# Patient Record
Sex: Male | Born: 1960 | Race: Black or African American | Hispanic: No | Marital: Single
Health system: Southern US, Community
[De-identification: ages and names within clinical notes are randomized; demographics above are authoritative.]

---

## 2005-01-02 ENCOUNTER — Ambulatory Visit: Payer: Self-pay | Admitting: Family Medicine

## 2005-02-21 ENCOUNTER — Ambulatory Visit: Payer: Self-pay | Admitting: Family Medicine

## 2005-08-05 ENCOUNTER — Ambulatory Visit: Payer: Self-pay | Admitting: Family Medicine

## 2005-09-03 ENCOUNTER — Ambulatory Visit: Payer: Self-pay | Admitting: Family Medicine

## 2005-11-12 ENCOUNTER — Ambulatory Visit: Payer: Self-pay | Admitting: Family Medicine

## 2010-09-15 ENCOUNTER — Inpatient Hospital Stay (HOSPITAL_COMMUNITY): Admission: AC | Admit: 2010-09-15 | Discharge: 2010-09-18 | Payer: Self-pay

## 2011-02-06 LAB — GLUCOSE, CAPILLARY
Glucose-Capillary: 122 mg/dL — ABNORMAL HIGH (ref 70–99)
Glucose-Capillary: 165 mg/dL — ABNORMAL HIGH (ref 70–99)
Glucose-Capillary: 172 mg/dL — ABNORMAL HIGH (ref 70–99)
Glucose-Capillary: 191 mg/dL — ABNORMAL HIGH (ref 70–99)
Glucose-Capillary: 193 mg/dL — ABNORMAL HIGH (ref 70–99)
Glucose-Capillary: 195 mg/dL — ABNORMAL HIGH (ref 70–99)
Glucose-Capillary: 205 mg/dL — ABNORMAL HIGH (ref 70–99)

## 2011-02-06 LAB — BASIC METABOLIC PANEL
BUN: 13 mg/dL (ref 6–23)
Calcium: 8.2 mg/dL — ABNORMAL LOW (ref 8.4–10.5)
Creatinine, Ser: 1.05 mg/dL (ref 0.4–1.5)
GFR calc Af Amer: >60 mL/min (ref >60)
GFR calc non Af Amer: >60 mL/min (ref >60)
GFR calc non Af Amer: >60 mL/min (ref >60)
Glucose, Bld: 240 mg/dL — ABNORMAL HIGH (ref 70–99)
Potassium: 3.8 mEq/L (ref 3.5–5.1)
Potassium: 4.5 mEq/L (ref 3.5–5.1)
Sodium: 136 mEq/L (ref 135–145)

## 2011-02-06 LAB — DIFFERENTIAL
Basophils Absolute: 0 10*3/uL (ref 0.0–0.1)
Basophils Relative: 0 % (ref 0–1)
Eosinophils Relative: 0 % (ref 0–5)
Monocytes Absolute: 0.6 10*3/uL (ref 0.1–1.0)
Neutro Abs: 9.5 10*3/uL — ABNORMAL HIGH (ref 1.7–7.7)

## 2011-02-06 LAB — CBC
HCT: 35 % — ABNORMAL LOW (ref 39.0–52.0)
Hemoglobin: 10.2 g/dL — ABNORMAL LOW (ref 13.0–17.0)
MCHC: 32.9 g/dL (ref 30.0–36.0)
MCV: 90.9 fL (ref 78.0–100.0)
Platelets: 298 10*3/uL (ref 150–400)
RBC: 3.41 MIL/uL — ABNORMAL LOW (ref 4.22–5.81)
RDW: 14 % (ref 11.5–15.5)
WBC: 8.2 10*3/uL (ref 4.0–10.5)

## 2011-02-06 LAB — HEMOGLOBIN A1C
Hgb A1c MFr Bld: 8.9 % — ABNORMAL HIGH (ref <5.7)
Mean Plasma Glucose: 209 mg/dL — ABNORMAL HIGH (ref <117)
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
## 2018-12-16 ENCOUNTER — Telehealth: Payer: Self-pay | Admitting: Student

## 2018-12-16 NOTE — Telephone Encounter (Signed)
Faxed prescription request to [facility] pharmacy in [place], [place] [phone])- Plavix 75 mg tablets, take one tablet by mouth once daily, dispense 90 tablets with 3 refills.  Waylan Boga Louk, PA-C 12/16/2018, 9:38 AM

## 2019-01-06 ENCOUNTER — Telehealth: Payer: Self-pay | Admitting: Physician Assistant

## 2019-01-06 NOTE — Telephone Encounter (Signed)
Received request for Plavix refill - per chart this was faxed in 12/16/18 with 3 refills. Re-[facility] this [facility] today again [facility] [address], [place], [place] [id number].

## 2020-04-20 ENCOUNTER — Telehealth: Payer: Self-pay | Admitting: Student

## 2020-04-20 NOTE — Telephone Encounter (Signed)
Per Dr. Archer Asa, refill of Plavix 75 mg PO daily #90 called in to patient's preferred pharmacy of [facility] on [address]   Loyce Dys, MS RD PA-C

## 2020-11-10 ENCOUNTER — Telehealth: Payer: Self-pay | Admitting: Student

## 2020-11-10 NOTE — Telephone Encounter (Signed)
Per Dr. Archer Asa, refill of Plavix 75 mg PO daily #90 with 1 refill called in to patient's preferred pharmacy of [facility] on [address]   Loyce Dys, MS RD PA-C 4:09 PM

## 2021-02-15 ENCOUNTER — Telehealth: Payer: Self-pay | Admitting: Student

## 2021-02-15 NOTE — Telephone Encounter (Signed)
Refill for Plavix 75 mg PO with 3 refills sent to patient's preferred pharmacy [facility] in [place].  Loyce Dys, MS RD PA-C

## 2022-08-22 ENCOUNTER — Telehealth (HOSPITAL_COMMUNITY): Payer: Self-pay | Admitting: Student

## 2022-08-22 MED ORDER — CLOPIDOGREL BISULFATE 75 MG PO TABS: 75.0000 mg | ORAL_TABLET | Freq: Every day | ORAL | 2 refills | Status: AC

## 2022-08-22 NOTE — Telephone Encounter (Signed)
Plavix 75 mg tablets, 1 tablet by mouth daily x 90 days e-prescribed to the patient's [facility] in [place], [place]. Voice message left for patient.   Soyla Dryer, [place] [phone] 08/22/2022, 8:30 AM

## 2022-12-02 ENCOUNTER — Other Ambulatory Visit (HOSPITAL_COMMUNITY): Payer: Self-pay | Admitting: Student

## 2022-12-02 ENCOUNTER — Telehealth (HOSPITAL_COMMUNITY): Payer: Self-pay | Admitting: Student

## 2022-12-02 MED ORDER — CLOPIDOGREL BISULFATE 75 MG PO TABS: 75.0000 mg | ORAL_TABLET | Freq: Every day | ORAL | 3 refills | Status: AC

## 2022-12-02 NOTE — Telephone Encounter (Signed)
Refill for Plavix 75 mg PO daily e-prescribed to the patient's [facility] in [place]. Ok to refill per Dr. Laurence Ferrari. 30 day supply with 3 refills.   Soyla Dryer, [place] [phone] 12/02/2022, 1:09 PM

## 2023-04-03 ENCOUNTER — Telehealth (HOSPITAL_COMMUNITY): Payer: Self-pay | Admitting: Student

## 2023-04-03 MED ORDER — CLOPIDOGREL BISULFATE 75 MG PO TABS: 75.0000 mg | ORAL_TABLET | Freq: Every day | ORAL | 6 refills | Status: DC

## 2023-04-03 NOTE — Telephone Encounter (Signed)
Plavix 75 mg one tablet daily, dispense #30 with 6 refills e-[facility] [place], [place]  Alwyn Ren, AGACNP-BC [phone] 04/03/2023, 12:52 PM

## 2023-11-14 ENCOUNTER — Telehealth (HOSPITAL_COMMUNITY): Payer: Self-pay | Admitting: Student

## 2023-11-14 MED ORDER — CLOPIDOGREL BISULFATE 75 MG PO TABS: 75.0000 mg | ORAL_TABLET | Freq: Every day | ORAL | 12 refills | Status: AC

## 2023-11-14 NOTE — Telephone Encounter (Signed)
Plavix 75 mg daily refill e-prescribed to  [facility] in [place], [place]. Dispense 30 with 12 refills.  Alwyn Ren, AGACNP-BC 11/14/2023, 1:45 PM

## 2024-11-11 NOTE — Progress Notes (Signed)
 Vascular Surgery Clinic Note:  Reason for Visit: PAD PCP: Hunter Gutierrez., MD  Assessment and Plan:  [phone] with hx of HTN, HLD, Dm2, and PAD with prior RLE SFA DCB and athrectomy in 2018 being seen for surveillance.  We reviewed the images from the ABI which demonstrates an ABI of R 1.0 and L 0.97, palpable bilaterally. Reduce waveform in the foot.  Best medical management includes ASA, Statin, and maintained smoking cessation. Cont surveillance in 1 year.   - PAD with L SFA intervention - f/u in 1 year with ABI for surveillance  Greater 50% of today's clinic visit was spent on counseling.   HPI:  42M with hx of HTN, HLD, Dm2, and PAD with prior RLE SFA DCB and athrectomy in 2018 being seen for surveillance and evaluation of carotid bruit.   Doing well. No new issues.  Retired but remains active. No rest pain, claudication or wounds.   In brief, he had his right leg treated in 2018 for walking pain and pain at night. He has resolution of his symptoms and was recently seen for this issue 1 month ago with normal studies. He otherwise denies walking pain, rest pain, or wounds. Additionally, no prior stroke or TIA. No events concerning for neurologic or vision changes. Former smoker. Quit several years ago.   Past Medical History:  Diagnosis Date   Cataract    both eyes   Diabetes mellitus (HCC)    Hypercholesterolemia    Hypertension    Past Surgical History:  Procedure Laterality Date   CATARACT EXTRACTION W/  INTRAOCULAR LENS IMPLANT Bilateral 2019   FEMORAL ARTERY ANGIOPLASTY Right 2018   common and superficial, no stent   LAPAROSCOPIC CHOLECYSTECTOMY N/A 2020   PILONIDAL CYST / SINUS EXCISION N/A    SCROTAL SURGERY N/A    infection   Prior to Admission medications   Medication Sig Start Date End Date Taking? Authorizing Provider  chlorhexidine (HIBICLENS) 4 % external liquid Apply topically daily as needed. Wash groin areas twice daily and as needed 04/23/22    Argenta, Hunter E, MD  clopidogreL  (PLAVIX ) 75 mg tablet  *ANTIPLATELET* TK 1 T PO QD 05/05/20   Dough, Hunter Gutierrez., MD  dicyclomine (BENTYL) 20 mg tablet Take 1 tablet (20 mg total) by mouth every 6 (six) hours. 11/09/21   [provider]  dorzolamide-timoloL (COSOPT) 22.3-6.8 mg/mL ophthalmic solution INSTILL 1 DROP IN BOTH EYES TWICE DAILY 06/17/22   [provider]  DUREZOL 0.05 % ophthalmic solution Place 1 drop into both eyes 2 times daily. 10/09/18   [provider]  ezetimibe (ZETIA) 10 mg tablet Take 1 tablet (10 mg total) by mouth daily. cholesterol 08/20/22   Dough, Hunter Gutierrez., MD  ibuprofen (ADVIL,MOTRIN) 600 MG tablet Take 1 tablet (600 mg total) by mouth every 6 (six) hours as needed.    [provider]  JARDIANCE 25 mg Tab tablet TAKE 1 TABLET BY MOUTH DAILY FOR DIABETES 04/14/22   Dough, Hunter Gutierrez., MD  LINZESS 290 mcg capsule TAKE 1 CAPSULE(290 MCG) BY MOUTH DAILY FOR CONSTIPATION 05/30/22   Dough, Hunter Gutierrez., MD  metFORMIN (GLUCOPHAGE) 500 MG tablet Take 1 tablet (500 mg total) by mouth 2 times daily with meals. diabetes 09/05/21   Dough, Hunter Gutierrez., MD  olmesartan-amLODIPine-hydrochlorothiazide Hunter Gutierrez) 40-10-12.5 mg per tablet TAKE 1 TABLET BY MOUTH EVERY MORNING FOR HIGH BLOOD PRESSURE 10/21/21   Dough, Hunter Gutierrez., MD  pantoprazole (PROTONIX) 40 MG  tablet Take 1 tablet (40 mg total) by mouth daily. reflux 06/06/21   Dough, Hunter Gutierrez., MD  pen needle, diabetic (BD INSULIN PEN NEEDLE UF SHORT) 31 gauge x 5/16 Ndle Place 2 Units onto the skin daily. 04/05/16   Dough, Hunter Gutierrez., MD  polyethylene glycol (GLYCOLAX, MIRALAX) 17 gram/dose powder MIX AND TAKE 17 GRAMS BY MOUTH ONCE DAILY 03/05/20   [provider]  rosuvastatin (CRESTOR) 40 MG tablet TAKE 1 TABLET(40 MG) BY MOUTH DAILY FOR CHOLESTEROL 07/27/22   Dough, Hunter Gutierrez., MD  tamsulosin (FLOMAX) 0.4 mg Cap capsule TAKE 1 CAPSULE BY MOUTH EVERY DAY 30  MINUTES AFTER SUPPER 11/12/21   [provider]  TRULICITY 3 mg/0.5 mL subcutaneous pen INJECT 3MG  UNDER THE SKIN ONCE EVERY WEEK 04/29/22   Dough, Hunter Gutierrez., MD   Allergies  Allergen Reactions   Latex Rash (ALLERGY/intolerance)    Pt reports it breaks me out   Social History   Tobacco Use   Smoking status: Former    Types: Cigarettes    Start date: 32    Quit date: 10/27/2017    Years since quitting: 4.9   Smokeless tobacco: Never  Substance Use Topics   Alcohol use: Yes    Comment: occ   Drug use: Yes    Types: Marijuana   Family History  Problem Relation Age of Onset   Diabetes Mother    Prostate cancer Brother    Colon cancer Neg Hx    Other Neg Hx        PASCVD    Review systems:  Complete review of systems negative other than what was mentioned in HPI and PMH.  Physical Exam:  Alert and Oriented BP 130/75 (BP Location: Left arm, Patient Position: Sitting)   Pulse 75   Ht 1.753 m (5' 9)   Wt 89.4 kg (197 lb)   SpO2 98%   BMI 29.09 kg/m  Neck soft and supple; No carotid bruits Regular, rate, and rhythm; no murmurs, rubs or gallops Clear to auscultation bilaterally Abd soft, Nontender DP and PT palpable bilaterally.  No open wounds or edema.   Imaging:  Images were personally reviewed and interpreted.   Hunter Chihuahua, MD Vascular and Endovascular Surgery [phone]

## 2024-11-16 NOTE — H&P (Signed)
 Gastroenterology Preprocedural History and Physical     Chief Complaint/Reason for Procedure: Hunter Gutierrez is a 63 y.o. male scheduled for a EUS, for the following indication Abnormal Imaging using deep sedation with propofol or general anesthesia as per anesthesia provider .  A History and Physical has been performed and patient medication allergies have been reviewed. The patient's tolerance of previous anesthesia has been reviewed. The risks and benefits of the procedure and the sedation options and risks were discussed with the patient. All questions were answered and informed consent obtained.  HPI  Medical History[1]  Surgical History[2]  Family History[3]  Social History   Socioeconomic History   Marital status: Married    Spouse name: Not on file   Number of children: Not on file   Years of education: HS +   Highest education level: Not on file  Occupational History   Not on file  Tobacco Use   Smoking status: Former    Current packs/day: 0.00    Types: Cigarettes    Start date: 11/25/1988    Quit date: 10/27/2017    Years since quitting: 7.0    Passive exposure: Past   Smokeless tobacco: Never  Vaping Use   Vaping status: Never Used  Substance and Sexual Activity   Alcohol use: Yes    Alcohol/week: 1.0 standard drink of alcohol    Types: 1 Cans of beer per week   Drug use: Yes    Types: Marijuana    Comment: once or twice a month   Sexual activity: Not on file  Other Topics Concern   Not on file  Social History Narrative   Not on file   Social Drivers of Health   Living Situation: Low Risk (09/10/2024)   Living Situation    What is your living situation today?: I have a steady place to live    Think about the place you live. Do you have problems with any of the following? Choose all that apply:: None/None on this list  Food Insecurity: Low Risk (09/10/2024)   Food vital sign    Within the past 12 months, you worried that your  food would run out before you got money to buy more: Never true    Within the past 12 months, the food you bought just didn't last and you didn't have money to get more: Never true  Transportation Needs: No Transportation Needs (09/10/2024)   Transportation    In the past 12 months, has lack of reliable transportation kept you from medical appointments, meetings, work or from getting things needed for daily living? : No  Utilities: Low Risk (09/10/2024)   Utilities    In the past 12 months has the electric, gas, oil, or water company threatened to shut off services in your home? : No  Safety: Low Risk (09/10/2024)   Safety    How often does anyone, including family and friends, physically hurt you?: Never    How often does anyone, including family and friends, insult or talk down to you?: Never    How often does anyone, including family and friends, threaten you with harm?: Never    How often does anyone, including family and friends, scream or curse at you?: Never  Alcohol Screening: Not At Risk (09/10/2024)   Alcohol    Audit C Alcohol risk score: 1  Tobacco Use: Medium Risk (11/16/2024)   Patient History    Smoking Tobacco Use: Former    Smokeless Tobacco Use: Never  Passive Exposure: Past  Depression: Not At Risk (11/12/2024)   PHQ-2    PHQ-2 Score: 0  Social Connections: Not on file  Financial Resource Strain: Not on file    Current Rx ordered in [facility][4]  Allergies[5]    Physical Exam:  There were no vitals filed for this visit. There is no height or weight on file to calculate BMI.  Airway:  MALLAMPATI TWO   Heart:  normal S1 and S2 Lungs:  clear Abdomen:  soft, nontender, normal bowel sounds Mental Status:  awake and alert; oriented to person, place, and time     ASA Grade Assessment: ASA 2 - Patient with mild systemic disease with no functional limitations   I have reviewed patient's health history and patient is cleared to proceed with the  proposed procedure at this facility.   Selinda JONETTA Southgate, MD       [1] Past Medical History: Diagnosis Date   Cataract    both eyes   Diabetes mellitus (CMD)    GERD (gastroesophageal reflux disease)    Glaucoma    Hypercholesterolemia    Hypertension   [2] Past Surgical History: Procedure Laterality Date   CATARACT EXTRACTION W/  INTRAOCULAR LENS IMPLANT Bilateral 2019   Procedure: CATARACT EXTRACTION W/  INTRAOCULAR LENS IMPLANT   FEMORAL ARTERY REPAIR Right 2018   Procedure: FEMORAL ARTERY ANGIOPLASTY; common and superficial, no stent   LAPAROSCOPIC CHOLECYSTECTOMY N/A 2020   Procedure: LAPAROSCOPIC CHOLECYSTECTOMY   PILONIDAL CYST / SINUS EXCISION N/A    Procedure: PILONIDAL CYST / SINUS EXCISION   SCROTAL SURGERY N/A    Procedure: SCROTAL SURGERY; infection  [3] Family History Problem Relation Name Age of Onset   Diabetes Mother Orlean    Prostate cancer Brother Josefa    Hypertension Brother Josefa    Colon cancer Neg Hx     Other Neg Hx         PASCVD  [4] Meds Ordered in [facility]  Medication Sig Dispense Refill   dicyclomine (BENTYL) 10 mg capsule Take 1 capsule (10 mg total) by mouth every 8 (eight) hours. As needed, bowel spasm 90 capsule 0   Jardiance 25 mg tab TAKE 1 TABLET BY MOUTH DAILY FOR DIABETES 30 tablet 11   metFORMIN (GLUCOPHAGE) 500 mg tablet TAKE 1 TABLET BY MOUTH TWICE DAILY WITH MEALS FOR DIABETES 180 tablet 3   olmesartan-hydroCHLOROthiazide 40-12.5 mg tab Take 1 tablet by mouth every morning. High blood pressure 90 tablet 3   rosuvastatin (CRESTOR) 40 mg tablet TAKE 1 TABLET BY MOUTH DAILY FOR CHOLESTEROL (Patient taking differently: Take 40 mg by mouth daily. for cholesterol) 90 tablet 3   semaglutide (Rybelsus) 14 mg tab tablet TAKE 1 TABLET BY MOUTH DAILY WITH BREAKFAST FOR DIABETES 90 tablet 3   tamsulosin (FLOMAX) 0.4 mg cap TAKE 1 CAPSULE BY MOUTH EVERY DAY 30 MINUTES AFTER SUPPER     bethanechol (URECHOLINE) 25  mg tablet 2 (two) times a day. for urinary retention     clopidogreL  (PLAVIX ) 75 mg tablet TK 1 T PO QD     dapsone 25 mg tablet Take 1 tablet 3x per day. 84 tablet 3   dorzolamide-timoloL (COSOPT) 22.3-6.8 mg/mL ophthalmic solution INSTILL 1 DROP IN BOTH EYES TWICE DAILY     doxycycline (VIBRA-TABS) 100 mg tablet TAKE 1 TABLET BY MOUTH TWICE DAILY. DO NOT LIE DOWN FOR AT LEAST 30 MINUTES. TAKE WITH 8 OUNCES OF WATER 60 tablet 1   DurezoL 0.05 % ophthalmic solution Administer  1 drop into each eyes 2 (two) times a day.     Linzess 145 mcg cap capsule TAKE 1 CAPSULE BY MOUTH DAILY FOR CONSTIPATION     pantoprazole (PROTONIX) 40 mg EC tablet Take 1 tablet (40 mg total) by mouth in the morning and 1 tablet (40 mg total) in the evening. Take before meals. 180 tablet 3   pen needle, diabetic 31 gauge x 5/16 ndle Place 2 Units on the skin Once Daily. 200 each 3   polyethylene glycol (MIRALAX) 17 gram powd powder Take 17 g by mouth daily as needed.     secukinumab (Cosentyx UnoReady Pen) 300 mg/2 mL pnij Inject 2 mL (300 mg total) under the skin every 28 days. 2 mL 5   secukinumab (Cosentyx UnoReady Pen) 300 mg/2 mL pnij Inject 300mg  under the skin at weeks 0,1,2,3, and 4. Then start maintenance dose 4 weeks later 8 mL 0   silver sulfADIAZINE (SSD) 1 % cream Apply topically 2 (two) times a day. Apply to affected area 400 g 6   Current Facility-Administered Medications Ordered in [facility]  Medication Dose Route Frequency Provider Last Rate Last Admin   dextrose (D50W) 50 % injection 12.5 g  12.5 g intravenous PRN Jason D Conway, MD       dextrose (TRUEPLUS GLUCOSE) 15 gram/32 mL oral gel 15 g  15 g oral PRN Jason D Conway, MD       lactated ringer's infusion  30 mL/hr intravenous Continuous Jason D Conway, MD      [5] Allergies Allergen Reactions   Latex Rash    Pt reports it breaks me out

## 2024-12-10 ENCOUNTER — Other Ambulatory Visit: Payer: Self-pay | Admitting: Student

## 2024-12-10 NOTE — Telephone Encounter (Signed)
 Interventional Radiology Brief Note:  Auto request for Plavix  refill generated from patient's pharmacy. Review of chart demonstrates that patient has not been seen with our office in several years. He has established care with Vascular Surgery and primary care at [facility] with a recent visit to Vascular Surgery in Dec 2025 at which time Plavix  is reportedly being prescribed by Dr. Ofilia, his PCP.  Will discontinue auto-refills from Dr. Jacquelin office at this time.   Shantel Helwig, MS RD PA-C

## 2024-12-14 NOTE — Telephone Encounter (Signed)
 Name and DOB were verified by [facility] at [facility]. She is calling stating that the Consentyx was canceled by the patient questionable due to co-payment.    Sent to PAP for plan of care
